# Patient Record
Sex: Female | Born: 1990 | Hispanic: No | Marital: Single | Smoking: Never smoker
Health system: Southern US, Community
[De-identification: ages and names within clinical notes are randomized; demographics above are authoritative.]

---

## 2011-03-15 ENCOUNTER — Emergency Department (INDEPENDENT_AMBULATORY_CARE_PROVIDER_SITE_OTHER)
Admission: EM | Admit: 2011-03-15 | Discharge: 2011-03-15 | Disposition: A | Discharge status: Home / Self Care | Payer: Medicaid Other | Source: Home / Self Care | Attending: Emergency Medicine | Admitting: Emergency Medicine

## 2011-03-15 ENCOUNTER — Encounter (HOSPITAL_COMMUNITY): Payer: Self-pay

## 2011-03-15 DIAGNOSIS — K529 Noninfective gastroenteritis and colitis, unspecified: Secondary | ICD-10-CM

## 2011-03-15 DIAGNOSIS — K5289 Other specified noninfective gastroenteritis and colitis: Secondary | ICD-10-CM

## 2011-03-15 MED ORDER — ONDANSETRON HCL 4 MG PO TABS: 4.0000 mg | ORAL_TABLET | Freq: Three times a day (TID) | ORAL | Status: AC | PRN

## 2011-03-15 MED ORDER — ACIDOPHILUS PROBIOTIC BLEND PO CAPS: 2.0000 | ORAL_CAPSULE | Freq: Two times a day (BID) | ORAL | Status: DC

## 2011-03-15 NOTE — Discharge Instructions (Signed)
Take the medication as written. Make sure you drink plenty of electrolyte containing fluids such as gatorade, pedialyte. You need to drink 2-3 liters of fluids over the rest of the day. This may include clear broths. You may start bland foods tomorrow. Return if you get worse, have a  fever >100.4, or for any concerns.   Go to www.goodrx.com to look up your medications. This will give you a list of where you can find your prescriptions at the most affordable prices.

## 2011-03-15 NOTE — ED Notes (Signed)
C/o upper and mid abdominal pain , w n/v since last PM; NAD at present

## 2011-03-15 NOTE — ED Provider Notes (Signed)
History     CSN: [id number]  Arrival date & time 03/15/11  1156   First MD Initiated Contact with Patient 03/15/11 1312      Chief Complaint  Patient presents with  . Abdominal Pain    (Consider location/radiation/quality/duration/timing/severity/associated sxs/prior treatment) HPI Comments: Patient reports multiple episodes of nbnb emesis and watery, nonbloody diarrhea starting approximately an hour after eating some chicken wings yesterday. No aggravating, alleviating factors. Has not tried anything for this. States that the vomiting has resolved, and the diarrhea is slowing down, but reports diffuse, generalized weakness and lightheadedness with rapid change in position.  Complains of diffuse, crampy abdominal pain prior to having diarrhea, but no other abdominal pain. Patient is able to drink fluids. No presyncope, palpitations or syncope. No decrease in urine output. No URI like symptoms, fevers, abdominal distention, back pain, vaginal discharge. Patient is currently on menses, but states it has been a normal period. No recent travel, raw/undercooked foods, recent antibiotics, medications, known sick contacts.  ROS as noted in HPI. All other ROS negative.   Patient is a 21 y.o. female presenting with GI illlness. The history is provided by the patient. No language interpreter was used.  GI Problem     History reviewed. No pertinent past medical history.  History reviewed. No pertinent past surgical history.  History reviewed. No pertinent family history.  History  Substance Use Topics  . Smoking status: Never Smoker   . Smokeless tobacco: Not on file  . Alcohol Use: No    OB History    Grav Para Term Preterm Abortions TAB SAB Ect Mult Living                  Review of Systems  Allergies  Crab  Home Medications   Current Outpatient Rx  Name Route Sig Dispense Refill  . ONDANSETRON HCL 4 MG PO TABS Oral Take 1 tablet (4 mg total) by mouth every 8 (eight) hours  as needed for nausea. May cause constipation 20 tablet 0  . ACIDOPHILUS PROBIOTIC BLEND PO CAPS Oral Take 2 capsules by mouth 2 (two) times daily. 60 capsule 0    BP 108/64  Pulse 70  Temp(Src) 98.2 F (36.8 C) (Oral)  Resp 16  SpO2 100%  LMP 03/09/2011  Physical Exam  Nursing note and vitals reviewed. Constitutional: She is oriented to person, place, and time. She appears well-developed and well-nourished. No distress.  HENT:  Head: Normocephalic and atraumatic.  Eyes: Conjunctivae and EOM are normal. Pupils are equal, round, and reactive to light.       Conjunctivae pink  Neck: Normal range of motion.  Cardiovascular: Normal rate, regular rhythm, normal heart sounds and normal pulses.   Pulmonary/Chest: Effort normal and breath sounds normal.  Abdominal: Soft. Bowel sounds are normal. She exhibits no distension. There is no tenderness. There is no rebound, no guarding and no CVA tenderness.  Musculoskeletal: Normal range of motion.  Lymphadenopathy:    She has no cervical adenopathy.  Neurological: She is alert and oriented to person, place, and time.  Skin: Skin is warm and dry.       Mucous membranes moist. Good skin turgor  Psychiatric: She has a normal mood and affect. Her behavior is normal. Judgment and thought content normal.    ED Course  Procedures (including critical care time)  Labs Reviewed - No data to display No results found.   1. Gastroenteritis       MDM  Database reviewed. No  previous records or labs available.  H&P most consistent with food poisoning and mild dehydration. Abdomen benign, patient tolerating fluids well. Patient denies any other sick contacts. Advised increased fluid intake, and was sent home with Zofran and probiotics.. Patient to followup with PMD of choice as needed.  Domenick Gong, MD 03/15/11 [phone]

## 2011-05-28 ENCOUNTER — Emergency Department (INDEPENDENT_AMBULATORY_CARE_PROVIDER_SITE_OTHER)
Admission: EM | Admit: 2011-05-28 | Discharge: 2011-05-28 | Disposition: A | Discharge status: Home / Self Care | Payer: Medicaid Other | Source: Home / Self Care | Attending: Emergency Medicine | Admitting: Emergency Medicine

## 2011-05-28 ENCOUNTER — Encounter (HOSPITAL_COMMUNITY): Payer: Self-pay | Admitting: Emergency Medicine

## 2011-05-28 DIAGNOSIS — R102 Pelvic and perineal pain: Secondary | ICD-10-CM

## 2011-05-28 DIAGNOSIS — N898 Other specified noninflammatory disorders of vagina: Secondary | ICD-10-CM

## 2011-05-28 DIAGNOSIS — N939 Abnormal uterine and vaginal bleeding, unspecified: Secondary | ICD-10-CM

## 2011-05-28 DIAGNOSIS — N949 Unspecified condition associated with female genital organs and menstrual cycle: Secondary | ICD-10-CM

## 2011-05-28 LAB — POCT URINALYSIS DIP (DEVICE)
Bilirubin Urine: NEGATIVE
Glucose, UA: NEGATIVE mg/dL
Nitrite: NEGATIVE
Specific Gravity, Urine: 1.015 (ref 1.005–1.030)
Urobilinogen, UA: 1 mg/dL (ref 0.0–1.0)

## 2011-05-28 LAB — POCT PREGNANCY, URINE: Preg Test, Ur: NEGATIVE

## 2011-05-28 LAB — WET PREP, GENITAL
Trich, Wet Prep: NONE SEEN
Yeast Wet Prep HPF POC: NONE SEEN

## 2011-05-28 MED ORDER — NAPROXEN 500 MG PO TABS: 500.0000 mg | ORAL_TABLET | Freq: Two times a day (BID) | ORAL | Status: DC

## 2011-05-28 NOTE — Discharge Instructions (Signed)
  If worsening pelvic pain or further bleeding have recommended you go to [facility]. For further evaluation the meantime take over-the-counter laxatives like MiraLax and take naproxen for discomfort.    Abnormal Vaginal Bleeding Abnormal vaginal bleeding means bleeding from the vagina that is not your normal menstrual period. Bleeding may be heavy or light. It may last for days or come and go. There are many problems that may cause this. HOME CARE  Keep track of your periods on a calendar if they are not regular.   Write down:   How often pads or tampons are changed.   The size and number of clots, if there are any.   A change in the color of the blood.   A change in the amount of blood.   Any smell.   The time and strength of cramps or pain.   Limit activity as told.   Eat a healthy diet.   Do not have sex (intercourse) until your doctor says it is okay.   Never have unprotected sex unless you are trying to get pregnant.   Only take medicine as told by your doctor.  GET HELP RIGHT AWAY IF:   You get dizzy or feel faint when standing up.   You have to change pads or tampons more than once an hour.   You feel a sudden change in your pain.   You start bleeding heavily.   You develop a fever.  MAKE SURE YOU:  Understand these instructions.   Will watch your condition.   Will get help right away if you are not doing well or get worse.  Document Released: 10/21/2008 Document Revised: 12/13/2010 Document Reviewed: 10/21/2008 [facility] Patient Information 2012 [place], [place].

## 2011-05-28 NOTE — ED Notes (Signed)
Patient noticed change in bowel routine 4 days ago, patient noticed decrease in number of stools.  Pain in stomach is varied, nonspecific.  Pain in back.  Denies burning with urination.  Reports vaginal spotting started yesterday, continues today.

## 2011-05-28 NOTE — ED Provider Notes (Signed)
History     CSN: [id number]  Arrival date & time 05/28/11  1318   First MD Initiated Contact with Patient 05/28/11 1405      Chief Complaint  Patient presents with  . Abdominal Pain    (Consider location/radiation/quality/duration/timing/severity/associated sxs/prior treatment) HPI Comments: Patient presents with ongoing discomfort in her lower abdomen (points to pelvic region). She she has had this. For more than 6 almost 7 days now which is somewhat unusual for she's been having discomfort in her lower abdomen she has not been able to go to the bathroom feels somewhat constipated. Denies any fevers, urinary symptoms, vaginal discharge or concerns or have been exposed to an STD.  Patient is a 21 y.o. female presenting with abdominal pain. The history is provided by the patient.  Abdominal Pain The primary symptoms of the illness include abdominal pain, vaginal discharge and vaginal bleeding. The primary symptoms of the illness do not include fever, shortness of breath, vomiting, diarrhea, hematemesis, hematochezia or dysuria. The onset of the illness was gradual. The problem has not changed since onset. The vaginal discharge is not associated with dysuria.  The patient states that she believes she is currently not pregnant. The patient has had a change in bowel habit. Symptoms associated with the illness do not include anorexia, diaphoresis, constipation, urgency, frequency or back pain. Significant associated medical issues do not include sickle cell disease, diverticulitis or cardiac disease.    History reviewed. No pertinent past medical history.  History reviewed. No pertinent past surgical history.  History reviewed. No pertinent family history.  History  Substance Use Topics  . Smoking status: Never Smoker   . Smokeless tobacco: Not on file  . Alcohol Use: No    OB History    Grav Para Term Preterm Abortions TAB SAB Ect Mult Living                  Review of Systems    Constitutional: Negative for fever and diaphoresis.  Respiratory: Negative for shortness of breath.   Gastrointestinal: Positive for abdominal pain. Negative for vomiting, diarrhea, constipation, hematochezia, anorexia and hematemesis.  Genitourinary: Positive for vaginal bleeding and vaginal discharge. Negative for dysuria, urgency and frequency.  Musculoskeletal: Negative for back pain.    Allergies  Crab  Home Medications   Current Outpatient Rx  Name Route Sig Dispense Refill  . NAPROXEN 500 MG PO TABS Oral Take 1 tablet (500 mg total) by mouth 2 (two) times daily with a meal. 10 tablet 0  . ACIDOPHILUS PROBIOTIC BLEND PO CAPS Oral Take 2 capsules by mouth 2 (two) times daily. 60 capsule 0    BP 119/70  Pulse 82  Temp(Src) 98.4 F (36.9 C) (Oral)  Resp 14  SpO2 99%  LMP 05/14/2011  Physical Exam  Constitutional: She appears well-developed and well-nourished.  HENT:  Head: Normocephalic.  Eyes: Conjunctivae are normal.  Abdominal: She exhibits no mass. There is tenderness. There is no rebound.  Genitourinary:    No labial fusion. There is no rash or tenderness on the right labia. There is no rash or tenderness on the left labia. There is bleeding around the vagina. No foreign body around the vagina. No vaginal discharge found.    ED Course  Procedures (including critical care time)  Labs Reviewed  POCT URINALYSIS DIP (DEVICE) - Abnormal; Notable for the following:    Hgb urine dipstick SMALL (*)    pH 8.5 (*)    Protein, ur 30 (*)  All other components within normal limits  WET PREP, GENITAL - Abnormal; Notable for the following:    Clue Cells Wet Prep HPF POC FEW (*)    WBC, Wet Prep HPF POC FEW (*)    All other components within normal limits  POCT PREGNANCY, URINE  URINALYSIS, DIPSTICK ONLY  GC/CHLAMYDIA PROBE AMP, GENITAL  GC/CHLAMYDIA PROBE AMP, GENITAL   No results found.   1. Vaginal bleeding, abnormal   2. Pelvic pain       MDM    Patient has been spotting and having lower abdominal pelvic pains. On exam had some mild globalized pelvic discomfort. With mild spotting. You shouldn't is not sexually active and denies any concerns or have been exposed to a STD and denies any urinary symptoms. This is concerned that this particular period has lasted longer than usual. Have encouraged patient that if of the pain was to exacerbate or more bleeding that she should go to [facility]. At this point her bleeding was considered to be mild        Jimmie Molly, MD 05/28/11 1629

## 2011-05-29 LAB — GC/CHLAMYDIA PROBE AMP, GENITAL: GC Probe Amp, Genital: NEGATIVE

## 2011-08-12 ENCOUNTER — Encounter (HOSPITAL_COMMUNITY): Payer: Self-pay | Admitting: Emergency Medicine

## 2011-08-12 ENCOUNTER — Emergency Department (HOSPITAL_COMMUNITY)
Admission: EM | Admit: 2011-08-12 | Discharge: 2011-08-13 | Disposition: A | Discharge status: Home / Self Care | Payer: Medicaid Other | Attending: Emergency Medicine | Admitting: Emergency Medicine

## 2011-08-12 DIAGNOSIS — M545 Low back pain, unspecified: Secondary | ICD-10-CM | POA: Insufficient documentation

## 2011-08-12 DIAGNOSIS — R509 Fever, unspecified: Secondary | ICD-10-CM | POA: Insufficient documentation

## 2011-08-12 DIAGNOSIS — R51 Headache: Secondary | ICD-10-CM | POA: Insufficient documentation

## 2011-08-12 LAB — URINALYSIS, ROUTINE W REFLEX MICROSCOPIC
Bilirubin Urine: NEGATIVE
Hgb urine dipstick: NEGATIVE
Ketones, ur: NEGATIVE mg/dL
Specific Gravity, Urine: 1.008 (ref 1.005–1.030)
Urobilinogen, UA: 0.2 mg/dL (ref 0.0–1.0)

## 2011-08-12 MED ORDER — ACETAMINOPHEN 325 MG PO TABS
650.0000 mg | ORAL_TABLET | Freq: Once | ORAL | Status: AC
  Administered 2011-08-12: 650 mg via ORAL

## 2011-08-12 NOTE — ED Provider Notes (Signed)
History     CSN: [id number]  Arrival date & time 08/12/11  2253   First MD Initiated Contact with Patient 08/12/11 2338      Chief Complaint  Patient presents with  . Headache    (Consider location/radiation/quality/duration/timing/severity/associated sxs/prior treatment) HPI Comments: Patient reports headache for the past few days, now having fever and low back pain.  She denies any urinary complaints.  No neck pain.    Patient is a 21 y.o. female presenting with headaches. The history is provided by the patient.  Headache  This is a new problem. Episode onset: 3 days ago. The problem occurs constantly. The problem has been gradually worsening. The headache is associated with nothing. Pain location: generalized. The quality of the pain is described as throbbing. Associated symptoms include a fever and nausea. Pertinent negatives include no palpitations, no shortness of breath and no vomiting.    History reviewed. No pertinent past medical history.  History reviewed. No pertinent past surgical history.  No family history on file.  History  Substance Use Topics  . Smoking status: Never Smoker   . Smokeless tobacco: Not on file  . Alcohol Use: No    OB History    Grav Para Term Preterm Abortions TAB SAB Ect Mult Living                  Review of Systems  Constitutional: Positive for fever, chills, appetite change and fatigue.  Respiratory: Negative for shortness of breath.   Cardiovascular: Negative for palpitations.  Gastrointestinal: Positive for nausea. Negative for vomiting.  Genitourinary: Negative for frequency, hematuria and flank pain.  Neurological: Positive for headaches.  All other systems reviewed and are negative.    Allergies  Crab  Home Medications   Current Outpatient Rx  Name Route Sig Dispense Refill  . IBUPROFEN 200 MG PO TABS Oral Take 200 mg by mouth every 6 (six) hours as needed. For pain      BP 111/71  Pulse 151  Temp 101.2 F (38.4  C) (Oral)  Resp 18  SpO2 97%  Physical Exam  Nursing note and vitals reviewed. Constitutional: She is oriented to person, place, and time. She appears well-developed and well-nourished. No distress.  HENT:  Head: Normocephalic and atraumatic.  Eyes: EOM are normal. Pupils are equal, round, and reactive to light.  Neck: Normal range of motion. Neck supple.  Cardiovascular: Regular rhythm.  Exam reveals no gallop and no friction rub.   No murmur heard.      tachycardic  Pulmonary/Chest: Effort normal and breath sounds normal. No respiratory distress. She has no wheezes.  Abdominal: Soft. Bowel sounds are normal. She exhibits no distension. There is no tenderness.  Musculoskeletal: Normal range of motion. She exhibits no edema.  Neurological: She is alert and oriented to person, place, and time.  Skin: Skin is warm and dry. She is not diaphoretic.    ED Course  Procedures (including critical care time)   Labs Reviewed  CBC WITH DIFFERENTIAL  BASIC METABOLIC PANEL  URINALYSIS, ROUTINE W REFLEX MICROSCOPIC   No results found.   No diagnosis found.    MDM  The patient presents with headache and back pain and was febrile upon triage.  She was also tachycardic with a heart rate in the 130's.  On exam, she did not appear meningitic and there was no nuchal rigidity.  I obtained laboratory studies, xrays of the lumbar spine, and ua.  All were essentially unremarkable.  There was  a mild elevation of the wbc count.    Her presentation and exam were non-focal and more suggestive of a viral syndrome.  She was given fluids and is feeling somewhat better.  The initial tachycardia at triage has resolved with fluids and resolution of her fever.  I highly doubt meningitis and do not feel as though an LP is indicated at this time unless her clinical picture changes.  She will be discharged to home, to follow up in the ED or with pcp prn.        Geoffery Lyons, MD 08/13/11 [phone]

## 2011-08-12 NOTE — ED Notes (Signed)
Reports was at funeral on Saturday, and since then has had h/a and lower back pain; pt reports also some diarrhea; denies dysuria, also reports nonproductive cough; pt appears upset at triage

## 2011-08-13 ENCOUNTER — Emergency Department (HOSPITAL_COMMUNITY): Payer: Medicaid Other

## 2011-08-13 LAB — BASIC METABOLIC PANEL
BUN: 10 mg/dL (ref 6–23)
Calcium: 9.3 mg/dL (ref 8.4–10.5)
Creatinine, Ser: 0.67 mg/dL (ref 0.50–1.10)
GFR calc Af Amer: >90 mL/min (ref >90)
GFR calc non Af Amer: >90 mL/min (ref >90)
Glucose, Bld: 123 mg/dL — ABNORMAL HIGH (ref 70–99)
Potassium: 3.4 mEq/L — ABNORMAL LOW (ref 3.5–5.1)

## 2011-08-13 LAB — CBC WITH DIFFERENTIAL/PLATELET
Basophils Relative: 0 % (ref 0–1)
Eosinophils Absolute: 0.1 10*3/uL (ref 0.0–0.7)
Eosinophils Relative: 1 % (ref 0–5)
Hemoglobin: 13.4 g/dL (ref 12.0–15.0)
Lymphs Abs: 1.2 10*3/uL (ref 0.7–4.0)
MCH: 27.8 pg (ref 26.0–34.0)
MCHC: 34.4 g/dL (ref 30.0–36.0)
MCV: 80.9 fL (ref 78.0–100.0)
Monocytes Absolute: 0.6 10*3/uL (ref 0.1–1.0)
Monocytes Relative: 6 % (ref 3–12)
RBC: 4.82 MIL/uL (ref 3.87–5.11)

## 2011-08-13 MED ORDER — ONDANSETRON HCL 4 MG/2ML IJ SOLN
4.0000 mg | Freq: Once | INTRAMUSCULAR | Status: AC
  Administered 2011-08-13: 4 mg via INTRAVENOUS
  Filled 2011-08-13: qty 2

## 2011-08-13 MED ORDER — HYDROCODONE-ACETAMINOPHEN 5-500 MG PO TABS: 1.0000 | ORAL_TABLET | Freq: Four times a day (QID) | ORAL | Status: AC | PRN

## 2011-08-13 MED ORDER — KETOROLAC TROMETHAMINE 30 MG/ML IJ SOLN
30.0000 mg | Freq: Once | INTRAMUSCULAR | Status: AC
  Administered 2011-08-13: 30 mg via INTRAVENOUS
  Filled 2011-08-13: qty 1

## 2011-08-13 MED ORDER — SODIUM CHLORIDE 0.9 % IV BOLUS (SEPSIS)
1000.0000 mL | Freq: Once | INTRAVENOUS | Status: AC
  Administered 2011-08-13: 1000 mL via INTRAVENOUS

## 2011-08-13 NOTE — ED Notes (Signed)
Pt reports reduce pain and denies any questions upon discharge.

## 2018-11-27 ENCOUNTER — Emergency Department (HOSPITAL_COMMUNITY)
Admission: EM | Admit: 2018-11-27 | Discharge: 2018-11-27 | Disposition: A | Discharge status: Home / Self Care | Attending: Emergency Medicine | Admitting: Emergency Medicine

## 2018-11-27 ENCOUNTER — Other Ambulatory Visit: Payer: Self-pay

## 2018-11-27 ENCOUNTER — Encounter (HOSPITAL_COMMUNITY): Payer: Self-pay | Admitting: Emergency Medicine

## 2018-11-27 DIAGNOSIS — N611 Abscess of the breast and nipple: Secondary | ICD-10-CM | POA: Diagnosis not present

## 2018-11-27 DIAGNOSIS — N644 Mastodynia: Secondary | ICD-10-CM | POA: Diagnosis present

## 2018-11-27 MED ORDER — LIDOCAINE-EPINEPHRINE 2 %-1:100000 IJ SOLN
20.0000 mL | Freq: Once | INTRAMUSCULAR | Status: AC
  Administered 2018-11-27: 18:00:00 20 mL
  Filled 2018-11-27: qty 20

## 2018-11-27 NOTE — Discharge Instructions (Signed)
You have been treated for left breast abscess with incision and drainage.  Take antibiotic previously prescribed.  Apply warm compress to left breast several times daily to aid with healing.  Follow up with specialist in the next few days for recheck and packing removal.

## 2018-11-27 NOTE — ED Provider Notes (Signed)
[facility] EMERGENCY DEPARTMENT Provider Note   CSN: [id number] Arrival date & time: 11/27/18  1625     History   Chief Complaint Chief Complaint  Patient presents with  . Breast Pain    HPI Erin Dougherty is a 28 y.o. female.     The history is provided by the patient. No language interpreter was used.     28 year old female presenting complaining of left breast pain.  Patient reports she has 8 months postpartum.  She does breast-feed on occasion.  She report for the past 4 days she has noticed increasing pain redness and swelling involving her left breast.  Pain is moderate in severity, nonradiating without any associated fever chills or shortness of breath.  She is up-to-date with tetanus.  She was seen at an urgent care center earlier this morning for her symptoms.  She was prescribed Keflex as well as referral to OB/GYN.  Patient however voiced concern for an abscess and would like to be addressed first prompting this ER visit.  She did take 1 dose of Keflex earlier today.  History reviewed. No pertinent past medical history.  There are no active problems to display for this patient.   History reviewed. No pertinent surgical history.   OB History   No obstetric history on file.      Home Medications    Prior to Admission medications   Medication Sig Start Date End Date Taking? Authorizing Provider  ibuprofen (ADVIL,MOTRIN) 200 MG tablet Take 200 mg by mouth every 6 (six) hours as needed. For pain    [provider]    Family History No family history on file.  Social History Social History   Tobacco Use  . Smoking status: Never Smoker  Substance Use Topics  . Alcohol use: No  . Drug use: No     Allergies   Crab [shellfish allergy]   Review of Systems Review of Systems  Constitutional: Negative for fever.  Skin: Positive for rash.     Physical Exam Updated Vital Signs BP (!) 139/92 (BP Location: Left Arm)   Pulse (!)  110   Temp 98.5 F (36.9 C) (Oral)   Resp 16   SpO2 97%   Physical Exam Vitals signs and nursing note reviewed.  Constitutional:      General: She is not in acute distress.    Appearance: She is well-developed.  HENT:     Head: Atraumatic.  Eyes:     Conjunctiva/sclera: Conjunctivae normal.  Neck:     Musculoskeletal: Neck supple.  Cardiovascular:     Rate and Rhythm: Tachycardia present.     Pulses: Normal pulses.     Heart sounds: Normal heart sounds.  Pulmonary:     Effort: Pulmonary effort is normal.     Breath sounds: Normal breath sounds.  Chest:    Abdominal:     Palpations: Abdomen is soft.     Tenderness: There is no abdominal tenderness.  Skin:    Findings: No rash.  Neurological:     Mental Status: She is alert.      ED Treatments / Results  Labs (all labs ordered are listed, but only abnormal results are displayed) Labs Reviewed - No data to display  EKG None  Radiology No results found.  Procedures .Marland KitchenIncision and Drainage  Date/Time: 11/27/2018 6:25 PM Performed by: Fayrene Helper, PA-C Authorized by: Fayrene Helper, PA-C   Consent:    Consent obtained:  Verbal   Consent given by:  Patient   Risks discussed:  Bleeding, incomplete drainage, pain and damage to other organs   Alternatives discussed:  No treatment Universal protocol:    Procedure explained and questions answered to patient or proxy's satisfaction: yes     Relevant documents present and verified: yes     Test results available and properly labeled: yes     Imaging studies available: yes     Required blood products, implants, devices, and special equipment available: yes     Site/side marked: yes     Immediately prior to procedure a time out was called: yes     Patient identity confirmed:  Verbally with patient Location:    Type:  Abscess   Size:  5   Location:  Trunk   Trunk location:  L breast Pre-procedure details:    Skin preparation:  Betadine Anesthesia (see MAR for  exact dosages):    Anesthesia method:  Local infiltration   Local anesthetic:  Lidocaine 1% WITH epi Procedure type:    Complexity:  Complex Procedure details:    Incision types:  Single straight   Incision depth:  Subcutaneous   Scalpel blade:  11   Wound management:  Probed and deloculated, irrigated with saline and extensive cleaning   Drainage:  Purulent   Drainage amount:  Moderate   Packing materials:  1/4 in gauze Post-procedure details:    Patient tolerance of procedure:  Tolerated well, no immediate complications   (including critical care time)  Medications Ordered in ED Medications  lidocaine-EPINEPHrine (XYLOCAINE W/EPI) 2 %-1:100000 (with pres) injection 20 mL (20 mLs Infiltration Given by Other 11/27/18 1829)     Initial Impression / Assessment and Plan / ED Course  I have reviewed the triage vital signs and the nursing notes.  Pertinent labs & imaging results that were available during my care of the patient were reviewed by me and considered in my medical decision making (see chart for details).        BP 118/85 (BP Location: Right Arm)   Pulse (!) 101   Temp 98.8 F (37.1 C) (Oral)   Resp 18   SpO2 97%    Final Clinical Impressions(s) / ED Diagnoses   Final diagnoses:  Breast abscess    ED Discharge Orders    None     5:36 PM Pt here with cellulitis and abscess formation noted to L breast.  Likely mastitis with obvious cutaneous abscess.  Will perform I&D.  Pt will f/u closely with specialist in 3 days.   6:26 PM Successful I&D of L breast abscess with copious purulent discharge.  Packing placed.  Pt will f/u with specialist in 3 days for packing removal.  Encourage pt to continue with abx.  Return precaution given.    Domenic Moras, PA-C 11/27/18 1839    Drenda Freeze, MD 11/27/18 [phone]

## 2018-11-27 NOTE — ED Triage Notes (Signed)
Pt c/o L breast pain and swelling x1 week, states she thinks she may have an abscess, denies drainage, fever or chills.

## 2018-11-27 NOTE — ED Notes (Signed)
Pt verbalizes understanding of d/c instructions. Pt ambulatory at d/c with all belongings.   

## 2018-12-07 ENCOUNTER — Other Ambulatory Visit: Payer: Self-pay | Admitting: Obstetrics & Gynecology

## 2018-12-07 DIAGNOSIS — N611 Abscess of the breast and nipple: Secondary | ICD-10-CM

## 2018-12-09 ENCOUNTER — Other Ambulatory Visit: Payer: Self-pay

## 2018-12-09 ENCOUNTER — Ambulatory Visit
Admission: RE | Admit: 2018-12-09 | Discharge: 2018-12-09 | Disposition: A | Discharge status: Home / Self Care | Source: Ambulatory Visit | Attending: Obstetrics & Gynecology | Admitting: Obstetrics & Gynecology

## 2018-12-09 DIAGNOSIS — N611 Abscess of the breast and nipple: Secondary | ICD-10-CM

## 2021-07-26 IMAGING — US US BREAST*L* LIMITED INC AXILLA
2 series · 5 of 5 positions shown · non-contrast
Comparison: None.

CLINICAL DATA: 28-year-old female presents for follow-up of left
breast abscess/mastitis. The patient went to the ED 11/27/2018 and
underwent I & D and packing of the abscess in the left breast at
that time. She is prescribed a course of antibiotics which she has
nearly completed and she has scheduled follow-up with a surgeon
later this month. The patient states this same packing is still in
her left breast and she is worried that the wound may heal with the
packing inside. Overall her symptoms have greatly improved.

EXAM:
ULTRASOUND OF THE LEFT BREAST

[Series 1: us breast*left* limited inc axilla · 0.06mm/px · 3 of 3 slices shown (1 of 2)]
[im 1/3]
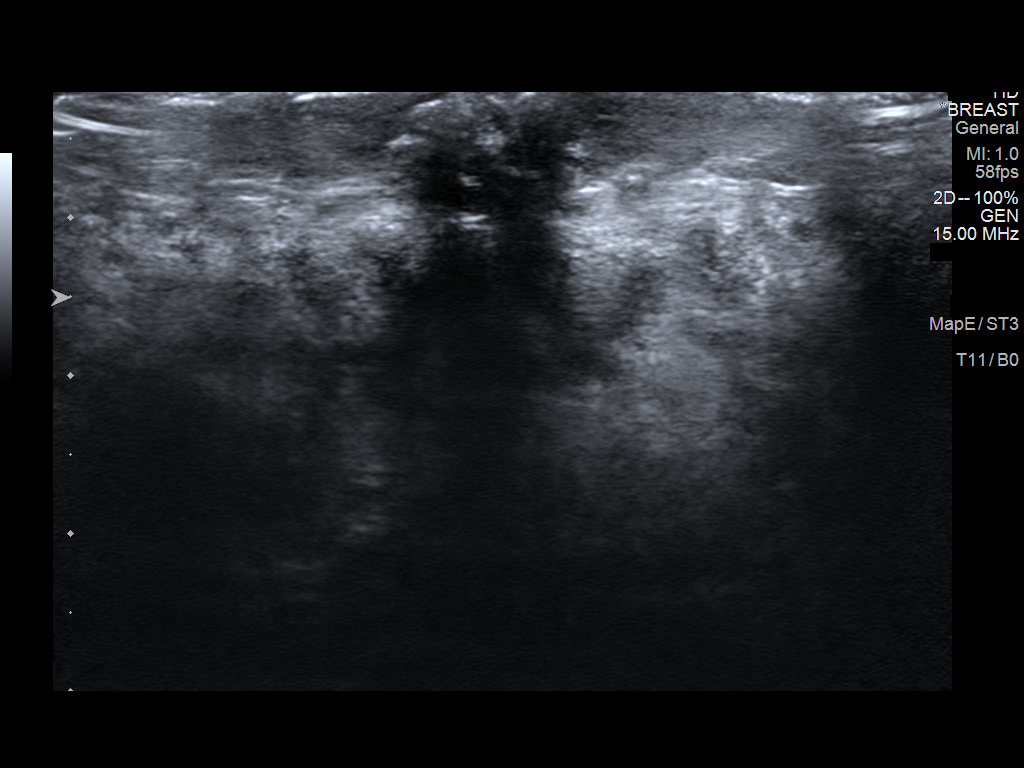
[im 2/3]
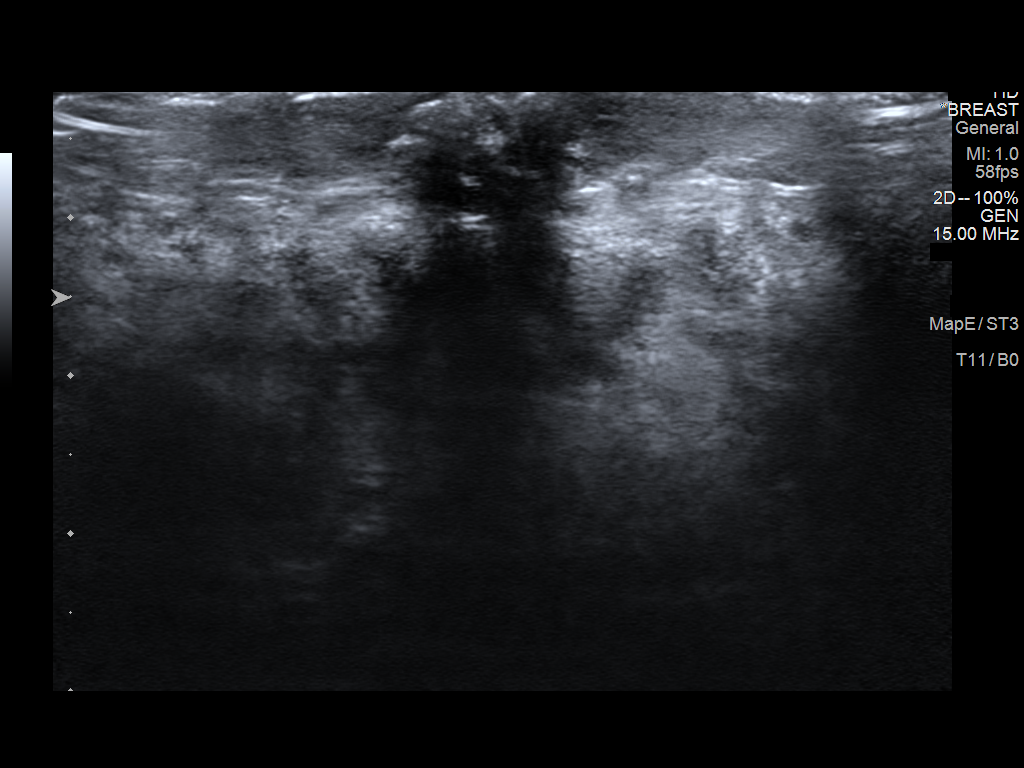
[im 3/3]
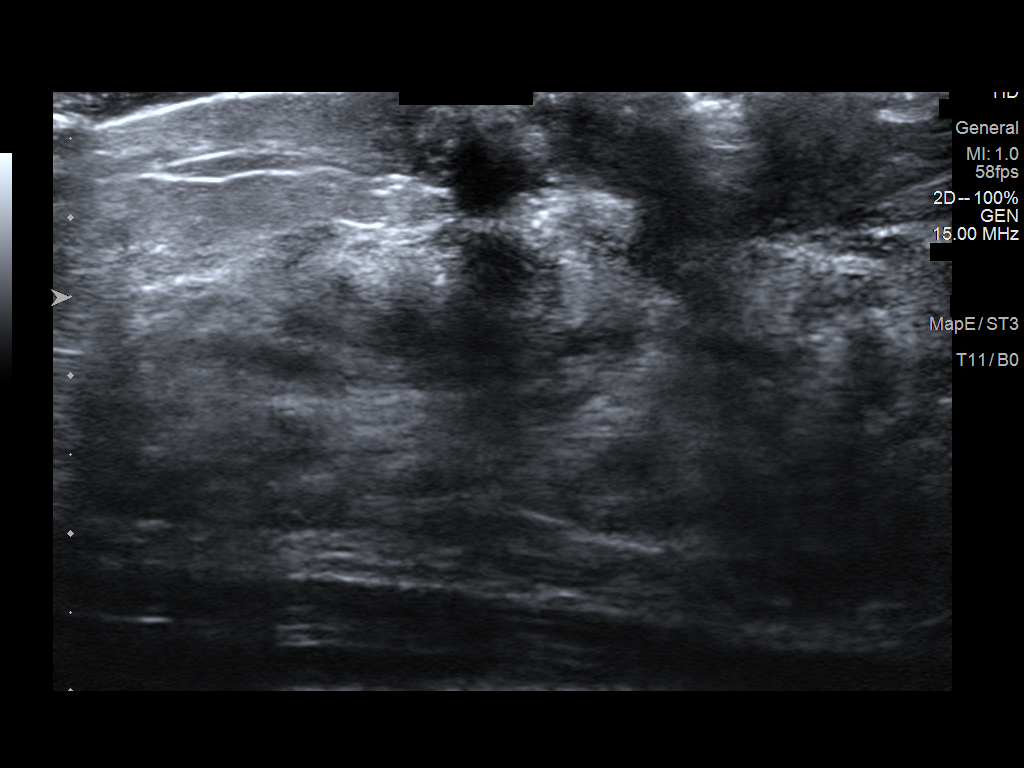

[Series 2: us breast*left* limited inc axilla · 0.07mm/px · 2 of 2 slices shown (2 of 2)]
[im 1/2]
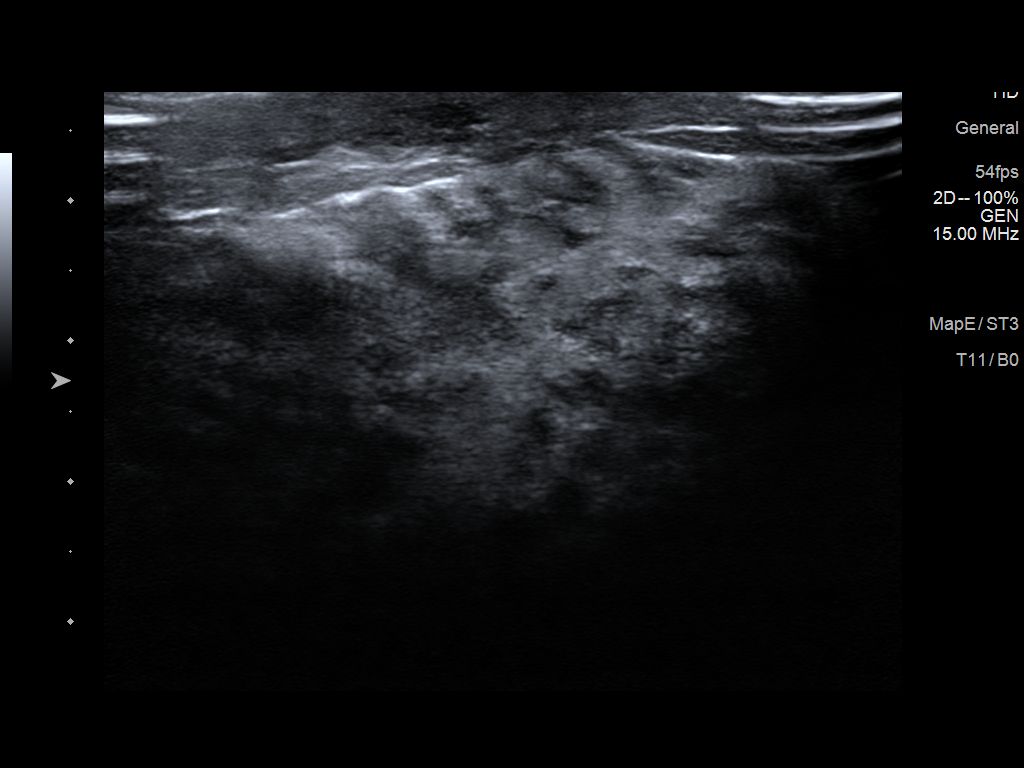
[im 2/2]
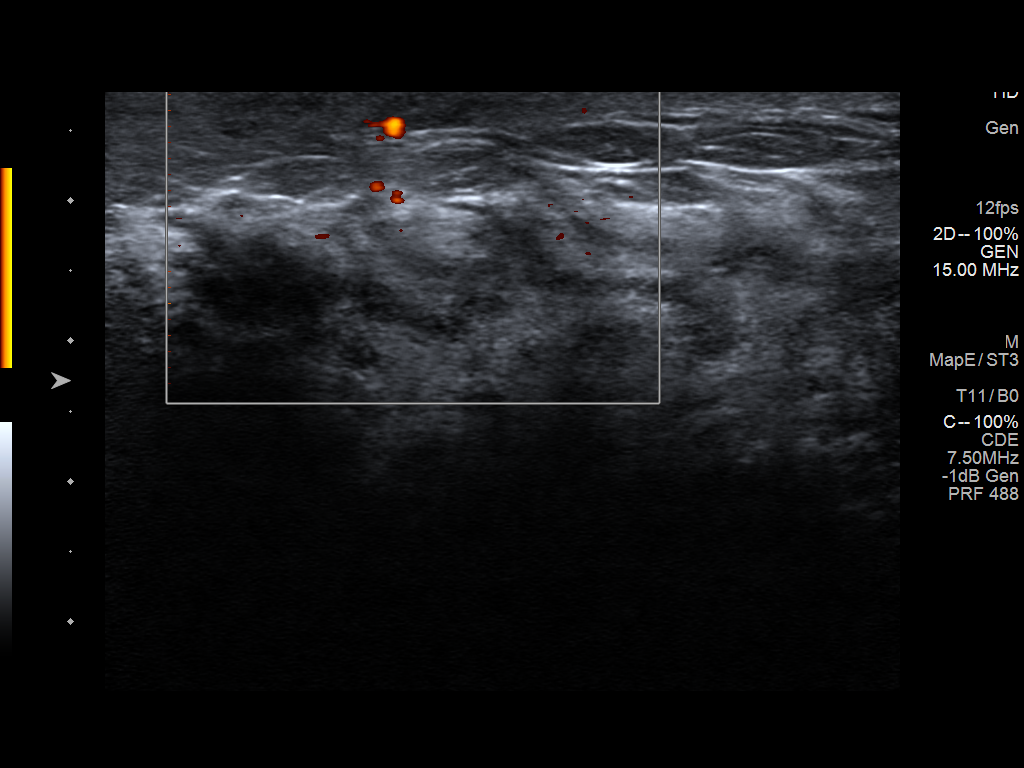

[5 of 5 positions shown; findings below may reference images not displayed]

FINDINGS: Physical examination reveals slight erythema involving the
periareolar left breast. The wound is otherwise clean, dry and
intact. The patient is slightly tender to palpation. There is no
fluctuance. No purulent drainage identified.

Targeted ultrasound of the left breast at 1 o'clock periareolar
reveals moderate skin thickening, however no fluid collections
identified in the breast. No underlying mass is seen.

The packing was subsequently removed given resolution of the left
breast abscess. The wound was then covered with Atle and a
Band-Aid.
IMPRESSION: Resolution of left breast abscess with mild to moderate residual
skin thickening in this location.

RECOMMENDATION:
Recommend the patient complete her course of antibiotics and
follow-up with her referring physician. If she experiences any
worsening of symptoms or feels as if her abscess is returning, she
has been instructed to call the [REDACTED] to return for repeat
evaluation/ultrasound.

I have discussed the findings and recommendations with the patient.
If applicable, a reminder letter will be sent to the patient
regarding the next appointment.

BI-RADS CATEGORY  2: Benign.
